# Patient Record
Sex: Male | Born: 1970 | Race: White | Hispanic: No | Marital: Married | Smoking: Never smoker
Health system: Southern US, Community
[De-identification: ages and names within clinical notes are randomized; demographics above are authoritative.]

---

## 2013-06-01 ENCOUNTER — Encounter (HOSPITAL_BASED_OUTPATIENT_CLINIC_OR_DEPARTMENT_OTHER): Payer: Self-pay

## 2013-06-01 ENCOUNTER — Emergency Department (HOSPITAL_BASED_OUTPATIENT_CLINIC_OR_DEPARTMENT_OTHER)
Admission: EM | Admit: 2013-06-01 | Discharge: 2013-06-01 | Disposition: A | Discharge status: Home / Self Care | Payer: BC Managed Care – PPO | Attending: Emergency Medicine | Admitting: Emergency Medicine

## 2013-06-01 DIAGNOSIS — Y9289 Other specified places as the place of occurrence of the external cause: Secondary | ICD-10-CM | POA: Insufficient documentation

## 2013-06-01 DIAGNOSIS — Y9389 Activity, other specified: Secondary | ICD-10-CM | POA: Insufficient documentation

## 2013-06-01 DIAGNOSIS — W57XXXA Bitten or stung by nonvenomous insect and other nonvenomous arthropods, initial encounter: Secondary | ICD-10-CM | POA: Insufficient documentation

## 2013-06-01 DIAGNOSIS — S60469A Insect bite (nonvenomous) of unspecified finger, initial encounter: Secondary | ICD-10-CM | POA: Insufficient documentation

## 2013-06-01 MED ORDER — DOXYCYCLINE HYCLATE 100 MG PO CAPS: 100.0000 mg | ORAL_CAPSULE | Freq: Two times a day (BID) | ORAL | Status: DC

## 2013-06-01 NOTE — ED Provider Notes (Signed)
  CSN: [id number]     Arrival date & time 06/01/13  1815 History  This chart was scribed for Charles B. Bernette Mayers, MD by Ardelia Mems, ED Scribe. This patient was seen in room MH02/MH02 and the patient's care was started at 7:51 PM.    Chief Complaint  Patient presents with  . Insect Bite    The history is provided by the patient. No language interpreter was used.    HPI Comments: Javier Rogers is a 42 y.o. male who presents to the Emergency Department complaining of a suspected spider bite to his left 4th finger that occurred about 3 hours ago. There is an area of redness and swelling to the finger, but there are no visible puncture wounds. He reports that he was moving an old couch out of his garage, where there are many spiders, and he suddenly noticed the area of swelling. He is concerned for the possibility that a brown recluse spider may have bit him. He states that he did not actually see an insect bite him. He states that he does not believe that he scratched the finger or otherwise injured it while moving the couch. He denies fever, chills, nausea, vomiting or any other symptoms. He denies any history of smoking and denies alcohol use.  PCP- Dr. Carlene Coria   History reviewed. No pertinent past medical history.  History reviewed. No pertinent past surgical history.  No family history on file.  History  Substance Use Topics  . Smoking status: Never Smoker   . Smokeless tobacco: Not on file  . Alcohol Use: Not on file    Review of Systems A complete 10 system review of systems was obtained and all systems are negative except as noted in the HPI and PMH.   Allergies  Review of patient's allergies indicates no known allergies.  Home Medications  No current outpatient prescriptions on file.  Triage Vitals: BP 120/75  Pulse 59  Temp(Src) 98.9 F (37.2 C) (Oral)  Resp 18  SpO2 100%  Physical Exam  Constitutional: He is oriented to person, place, and time. He  appears well-developed and well-nourished.  HENT:  Head: Normocephalic and atraumatic.  Neck: Neck supple.  Pulmonary/Chest: Effort normal.  Musculoskeletal:  Erythema to dorsal L ring finger mostly over the distal and middle phalanx, no difficulty with ROM, no drainage, no skin breaks  Neurological: He is alert and oriented to person, place, and time. No cranial nerve deficit.  Psychiatric: He has a normal mood and affect. His behavior is normal.    ED Course   Procedures (including critical care time)  DIAGNOSTIC STUDIES: Oxygen Saturation is 100% on RA, normal by my interpretation.    COORDINATION OF CARE: 7:54 PM- Pt advised of plan for treatment and pt agrees.   Labs Reviewed - No data to display  No results found.  1. Spider bite, initial encounter     MDM  Pt with likely spider bite on L ring finger, no definite signs of infection and no necrosis. Pt agrees does not need Abx now, but given Rx for Doxy to start if redness spreads or swelling develops. He is comfortable with that plan.        I personally performed the services described in this documentation, which was scribed in my presence. The recorded information has been reviewed and is accurate.     Charles B. Bernette Mayers, MD 06/01/13 [phone]

## 2013-06-01 NOTE — ED Notes (Signed)
MD at bedside. 

## 2013-06-01 NOTE — ED Notes (Signed)
Patient here with possible spider bite to left hand ring finger, was working in garage and noticed redness and discoloration to digit. No pain and did not feel bite.

## 2014-04-15 ENCOUNTER — Encounter: Payer: Self-pay | Admitting: Family Medicine

## 2014-04-15 ENCOUNTER — Ambulatory Visit (INDEPENDENT_AMBULATORY_CARE_PROVIDER_SITE_OTHER): Payer: No Typology Code available for payment source | Admitting: Family Medicine

## 2014-04-15 ENCOUNTER — Encounter (INDEPENDENT_AMBULATORY_CARE_PROVIDER_SITE_OTHER): Payer: Self-pay

## 2014-04-15 VITALS — BP 124/77 | HR 55 | Ht 69.0 in | Wt 160.0 lb

## 2014-04-15 DIAGNOSIS — M25569 Pain in unspecified knee: Secondary | ICD-10-CM

## 2014-04-15 DIAGNOSIS — M25561 Pain in right knee: Secondary | ICD-10-CM

## 2014-04-15 NOTE — Patient Instructions (Signed)
You have patellofemoral syndrome Avoid painful activities (especially squats and lunges, plyometrics) Straight leg raise, hip side raises, straight leg raise with foot turned outwards 3 sets of 10 once a day. Add ankle weight if these become too easy. Consider formal physical therapy Consider Dr. Jari SportsmanScholls active series or something similar. Icing 15 minutes at a time 3-4 times a day as needed. Tylenol or aleve as needed for pain Otherwise activities as tolerated. Follow up with me in 6 weeks or as needed.

## 2014-04-20 ENCOUNTER — Encounter: Payer: Self-pay | Admitting: Family Medicine

## 2014-04-20 DIAGNOSIS — M25561 Pain in right knee: Secondary | ICD-10-CM | POA: Insufficient documentation

## 2014-04-20 NOTE — Progress Notes (Signed)
Patient ID: Ulla PotashMark Holstrom, male   DOB: 03/13/1971, 43 y.o.   MRN: [id number]  PCP: Carlene CoriaBRASWELL,SHERRILL, MD  Subjective:   HPI: Patient is a 43 y.o. male here for right knee pain.  Patient states he's had pain under right kneecap for about 6 months. No known injury. Hurts only when running. [facility] [place] [facility] at the time this started - begins about 30 minutes into his runs. Tried new shoes prior to this that had more of a heel lift. No catching, locking, giving out. Slight pain on left.  History reviewed. No pertinent past medical history.  Current Outpatient Prescriptions on File Prior to Visit  Medication Sig Dispense Refill  . doxycycline (VIBRAMYCIN) 100 MG capsule Take 1 capsule (100 mg total) by mouth 2 (two) times daily.  14 capsule  0   No current facility-administered medications on file prior to visit.    History reviewed. No pertinent past surgical history.  No Known Allergies  History   Social History  . Marital Status: Married    Spouse Name: N/A    Number of Children: N/A  . Years of Education: N/A   Occupational History  . Not on file.   Social History Main Topics  . Smoking status: Never Smoker   . Smokeless tobacco: Not on file  . Alcohol Use: Not on file  . Drug Use: Not on file  . Sexual Activity: Not on file   Other Topics Concern  . Not on file   Social History Narrative  . No narrative on file    No family history on file.  BP 124/77  Pulse 55  Ht 5\' 9"  (1.753 m)  Wt 160 lb (72.576 kg)  BMI 23.62 kg/m2  Review of Systems: See HPI above.    Objective:  Physical Exam:  Gen: NAD  Right knee: No gross deformity, ecchymoses, swelling. No TTP currently. FROM. Negative ant/post drawers. Negative valgus/varus testing. Negative lachmanns. Negative mcmurrays, apleys, patellar apprehension. NV intact distally. Mild overpronation More outturning right foot with ambulation and running. Leg lengths 89cm left, 88.5cm  right. Hip abduction 5-/5.    Assessment & Plan:  1. Right knee pain - no pain currently but history and exam consistent with patellofemoral syndrome and risk factors.  Start home exercise program which was demonstrated today.  Arch supports.  Icing, tylenol/nsaids as needed.  Consider physical therapy.  Has a slight leg length discrepancy - will monitor - doubt this is large enough to be contributory.  F/u in 6 weeks or prn.

## 2014-04-20 NOTE — Assessment & Plan Note (Signed)
no pain currently but history and exam consistent with patellofemoral syndrome and risk factors.  Start home exercise program which was demonstrated today.  Arch supports.  Icing, tylenol/nsaids as needed.  Consider physical therapy.  Has a slight leg length discrepancy - will monitor - doubt this is large enough to be contributory.  F/u in 6 weeks or prn.

## 2014-05-27 ENCOUNTER — Ambulatory Visit (INDEPENDENT_AMBULATORY_CARE_PROVIDER_SITE_OTHER): Payer: No Typology Code available for payment source | Admitting: Family Medicine

## 2014-05-27 ENCOUNTER — Encounter: Payer: Self-pay | Admitting: Family Medicine

## 2014-05-27 VITALS — BP 124/81 | HR 52 | Ht 69.0 in | Wt 165.0 lb

## 2014-05-27 DIAGNOSIS — M25561 Pain in right knee: Secondary | ICD-10-CM

## 2014-05-27 DIAGNOSIS — M25569 Pain in unspecified knee: Secondary | ICD-10-CM

## 2014-05-28 ENCOUNTER — Encounter: Payer: Self-pay | Admitting: Family Medicine

## 2014-05-28 NOTE — Assessment & Plan Note (Signed)
2/2 patellofemoral syndrome and risk factors.  Improved clinically as well as better hip abduction.  Continue HEP, arch supports.  F/u prn.

## 2014-05-28 NOTE — Progress Notes (Signed)
Patient ID: Javier Rogers, male   DOB: 02/16/1971, 43 y.o.   MRN: [id number]  PCP: Carlene CoriaBRASWELL,SHERRILL, MD  Subjective:   HPI: Patient is a 43 y.o. male here for right knee pain.  7/1: Patient states he's had pain under right kneecap for about 6 months. No known injury. Hurts only when running. [facility] [place] [facility] [employer] [facility] [facility] the [facility] this started - begins about 30 minutes into his runs. Tried new shoes prior to this that had more of a heel lift. No catching, locking, giving out. Slight pain on left.  8/12: Patient reports he is at least 60% improved from last visit. Doing home exercises, wearing arch supports. Not needing any medications though. Able to do 90 minute run without any issues. Some pain when cycling though. No swelling or bruising. No catching, locking, giving out.  History reviewed. No pertinent past medical history.  No current outpatient prescriptions on file prior to visit.   No current facility-administered medications on file prior to visit.    History reviewed. No pertinent past surgical history.  No Known Allergies  History   Social History  . Marital Status: Married    Spouse Name: N/A    Number of Children: N/A  . Years of Education: N/A   Occupational History  . Not on file.   Social History Main Topics  . Smoking status: Never Smoker   . Smokeless tobacco: Not on file  . Alcohol Use: Not on file  . Drug Use: Not on file  . Sexual Activity: Not on file   Other Topics Concern  . Not on file   Social History Narrative  . No narrative on file    No family history on file.  BP 124/81  Pulse 52  Ht 5\' 9"  (1.753 m)  Wt 165 lb (74.844 kg)  BMI 24.36 kg/m2  Review of Systems: See HPI above.    Objective:  Physical Exam:  Gen: NAD  Right knee: No gross deformity, ecchymoses, swelling. No TTP currently. FROM. Negative ant/post drawers. Negative valgus/varus testing. Negative lachmanns. Negative mcmurrays, apleys,  patellar apprehension. NV intact distally. Mild overpronation More outturning right foot with ambulation and running. Hip abduction 5/5.    Assessment & Plan:  1. Right knee pain - 2/2 patellofemoral syndrome and risk factors.  Improved clinically as well as better hip abduction.  Continue HEP, arch supports.  F/u prn.

## 2014-06-15 ENCOUNTER — Emergency Department (HOSPITAL_BASED_OUTPATIENT_CLINIC_OR_DEPARTMENT_OTHER)
Admission: EM | Admit: 2014-06-15 | Discharge: 2014-06-15 | Disposition: A | Discharge status: Home / Self Care | Payer: No Typology Code available for payment source | Attending: Emergency Medicine | Admitting: Emergency Medicine

## 2014-06-15 ENCOUNTER — Encounter (HOSPITAL_BASED_OUTPATIENT_CLINIC_OR_DEPARTMENT_OTHER): Payer: Self-pay | Admitting: Emergency Medicine

## 2014-06-15 ENCOUNTER — Emergency Department (HOSPITAL_BASED_OUTPATIENT_CLINIC_OR_DEPARTMENT_OTHER): Payer: No Typology Code available for payment source

## 2014-06-15 DIAGNOSIS — Y92410 Unspecified street and highway as the place of occurrence of the external cause: Secondary | ICD-10-CM | POA: Diagnosis not present

## 2014-06-15 DIAGNOSIS — S0993XA Unspecified injury of face, initial encounter: Secondary | ICD-10-CM | POA: Insufficient documentation

## 2014-06-15 DIAGNOSIS — S0990XA Unspecified injury of head, initial encounter: Secondary | ICD-10-CM | POA: Diagnosis not present

## 2014-06-15 DIAGNOSIS — Y9389 Activity, other specified: Secondary | ICD-10-CM | POA: Diagnosis not present

## 2014-06-15 DIAGNOSIS — M47812 Spondylosis without myelopathy or radiculopathy, cervical region: Secondary | ICD-10-CM

## 2014-06-15 DIAGNOSIS — S199XXA Unspecified injury of neck, initial encounter: Secondary | ICD-10-CM | POA: Diagnosis not present

## 2014-06-15 MED ORDER — CYCLOBENZAPRINE HCL 10 MG PO TABS: 10.0000 mg | ORAL_TABLET | Freq: Two times a day (BID) | ORAL | Status: DC | PRN

## 2014-06-15 NOTE — Discharge Instructions (Signed)
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
## 2015-05-26 ENCOUNTER — Ambulatory Visit (INDEPENDENT_AMBULATORY_CARE_PROVIDER_SITE_OTHER): Payer: 59 | Admitting: Family Medicine

## 2015-05-26 ENCOUNTER — Encounter: Payer: Self-pay | Admitting: Family Medicine

## 2015-05-26 VITALS — BP 124/80 | HR 60 | Ht 69.0 in | Wt 165.0 lb

## 2015-05-26 DIAGNOSIS — M79604 Pain in right leg: Secondary | ICD-10-CM | POA: Diagnosis not present

## 2015-05-26 MED ORDER — METHYLPREDNISOLONE ACETATE 40 MG/ML IJ SUSP
40.0000 mg | Freq: Once | INTRAMUSCULAR | Status: AC
  Administered 2015-05-26: 40 mg via INTRA_ARTICULAR

## 2015-05-26 NOTE — Patient Instructions (Signed)
You are either getting bursitis under the quadratus muscle or overuse strain of this muscle. You were given an injection today. I would lay off sprinting, hills for a week and focus on swimming, cycling. Consider nitro patches, dry needling for this in the future. Follow up with me as needed but call if you have any questions.

## 2015-05-31 DIAGNOSIS — M79604 Pain in right leg: Secondary | ICD-10-CM | POA: Insufficient documentation

## 2015-05-31 NOTE — Progress Notes (Signed)
PCP: Carlene Coria, MD  Subjective:   HPI: Patient is a 44 y.o. male here for right leg pain.  Patient is an avid runner. He started to get pain in proximal hamstring/buttock area of right leg in mid-June. Recalls this shooting down posterior leg one run. Tried compression sleeve, PT. Increase speed an hills bother him. Doing home exercises and stretching. No swelling. Tried icing, ibuprofen as well  No past medical history on file.  Current Outpatient Prescriptions on File Prior to Visit  Medication Sig Dispense Refill  . cyclobenzaprine (FLEXERIL) 10 MG tablet Take 1 tablet (10 mg total) by mouth 2 (two) times daily as needed for muscle spasms. 20 tablet 0   No current facility-administered medications on file prior to visit.    No past surgical history on file.  No Known Allergies  Social History   Social History  . Marital Status: Married    Spouse Name: N/A  . Number of Children: N/A  . Years of Education: N/A   Occupational History  . Not on file.   Social History Main Topics  . Smoking status: Never Smoker   . Smokeless tobacco: Not on file  . Alcohol Use: No  . Drug Use: No  . Sexual Activity: Not on file   Other Topics Concern  . Not on file   Social History Narrative    No family history on file.  BP 124/80 mmHg  Pulse 60  Ht  (1.753 m)  Wt 165 lb (74.844 kg)  BMI 24.36 kg/m2  Review of Systems: See HPI above.    Objective:  Physical Exam:  Gen: NAD  Right leg: No gross deformity, swelling, bruising. TTP just lateral to ischeal tuberosity.  No other tenderness. FROM hip and knee. No pain with resisted knee flexion at 30 and 90 degrees, hip abduction or ext rotation. Strength 5/5 all motions of hip, knee. NVI distally.  MSK u/s:  No abnormalities, muscle tears, bursitis obviously visualized in area of pain near quadratus insertion.    Assessment & Plan:  1. Right leg pain - consistent with overuse strain of quadratus of  right hip based on location of pain, exam, ultrasound.  Has been doing home exercises, nsaids, icing, sleeve, PT.  Went ahead with injection today with ultrasound guidance.  Pain resolved following this injection.  After informed written consent patient was lying prostrate on exam table.  Area overlying maximal location of pain at medial quadratus prepped with alcohol swab then injected with 2:1 marcaine: depomedrol with ultrasound guidance.  Patient tolerated the procedure well without immediate complications.

## 2015-05-31 NOTE — Assessment & Plan Note (Signed)
consistent with overuse strain of quadratus of right hip based on location of pain, exam, ultrasound.  Has been doing home exercises, nsaids, icing, sleeve, PT.  Went ahead with injection today with ultrasound guidance.  Pain resolved following this injection.  After informed written consent patient was lying prostrate on exam table.  Area overlying maximal location of pain at medial quadratus prepped with alcohol swab then injected with 2:1 marcaine: depomedrol with ultrasound guidance.  Patient tolerated the procedure well without immediate complications.  

## 2015-05-31 NOTE — Assessment & Plan Note (Deleted)
consistent with overuse strain of quadratus of right hip based on location of pain, exam, ultrasound.  Has been doing home exercises, nsaids, icing, sleeve, PT.  Went ahead with injection today with ultrasound guidance.  Pain resolved following this injection.  After informed written consent patient was lying prostrate on exam table.  Area overlying maximal location of pain at medial quadratus prepped with alcohol swab then injected with 2:1 marcaine: depomedrol with ultrasound guidance.  Patient tolerated the procedure well without immediate complications.

## 2015-06-17 ENCOUNTER — Ambulatory Visit: Payer: 59 | Admitting: Family Medicine
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
## 2015-10-21 ENCOUNTER — Emergency Department (HOSPITAL_BASED_OUTPATIENT_CLINIC_OR_DEPARTMENT_OTHER)
Admission: EM | Admit: 2015-10-21 | Discharge: 2015-10-21 | Disposition: A | Discharge status: Home / Self Care | Payer: 59 | Attending: Emergency Medicine | Admitting: Emergency Medicine

## 2015-10-21 ENCOUNTER — Encounter (HOSPITAL_BASED_OUTPATIENT_CLINIC_OR_DEPARTMENT_OTHER): Payer: Self-pay

## 2015-10-21 DIAGNOSIS — Y998 Other external cause status: Secondary | ICD-10-CM | POA: Diagnosis not present

## 2015-10-21 DIAGNOSIS — S39012A Strain of muscle, fascia and tendon of lower back, initial encounter: Secondary | ICD-10-CM | POA: Insufficient documentation

## 2015-10-21 DIAGNOSIS — S0990XA Unspecified injury of head, initial encounter: Secondary | ICD-10-CM | POA: Insufficient documentation

## 2015-10-21 DIAGNOSIS — M5416 Radiculopathy, lumbar region: Secondary | ICD-10-CM | POA: Insufficient documentation

## 2015-10-21 DIAGNOSIS — R51 Headache: Secondary | ICD-10-CM

## 2015-10-21 DIAGNOSIS — Y9389 Activity, other specified: Secondary | ICD-10-CM | POA: Diagnosis not present

## 2015-10-21 DIAGNOSIS — Y92410 Unspecified street and highway as the place of occurrence of the external cause: Secondary | ICD-10-CM | POA: Insufficient documentation

## 2015-10-21 DIAGNOSIS — R519 Headache, unspecified: Secondary | ICD-10-CM

## 2015-10-21 DIAGNOSIS — S3992XA Unspecified injury of lower back, initial encounter: Secondary | ICD-10-CM | POA: Diagnosis present

## 2015-10-21 MED ORDER — ORPHENADRINE CITRATE ER 100 MG PO TB12: 100.0000 mg | ORAL_TABLET | Freq: Two times a day (BID) | ORAL | Status: DC

## 2015-10-21 MED ORDER — NAPROXEN 500 MG PO TABS: 500.0000 mg | ORAL_TABLET | Freq: Two times a day (BID) | ORAL | Status: DC
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
## 2019-05-13 ENCOUNTER — Encounter: Payer: Self-pay | Admitting: Family Medicine

## 2019-05-13 ENCOUNTER — Ambulatory Visit (INDEPENDENT_AMBULATORY_CARE_PROVIDER_SITE_OTHER): Payer: Self-pay | Admitting: Family Medicine

## 2019-05-13 DIAGNOSIS — M79601 Pain in right arm: Secondary | ICD-10-CM

## 2019-05-13 DIAGNOSIS — M75101 Unspecified rotator cuff tear or rupture of right shoulder, not specified as traumatic: Secondary | ICD-10-CM

## 2019-05-13 NOTE — Progress Notes (Signed)
I saw and examined the patient with Dr. Mayer Masker and agree with assessment and plan as outlined.  Works with Javier Rogers in PT. ultrasound today shows normal subscapularis tendon, normal long head biceps tendon but with fluid in the sheath.  Supraspinatus has a partial tear midportion with some retraction.  There is subacromial/subdeltoid bursitis.  Infraspinatus looks intact.  Did not bill for or required ultrasound images today.  Elected to inject the subacromial bursa, used ultrasound to guide needle placement.  He had good relief during the immediate anesthetic phase.  He will start doing strengthening with Javier.  If pain flares up again, would order MRI scan.

## 2019-05-13 NOTE — Progress Notes (Signed)
Javier Rogers - 48 y.o. male MRN [id number]  Date of birth: 1971-09-20  Office Visit Note: Visit Date: 05/13/2019 PCP: Vickii Chafe, MD Referred by: Vickii Chafe, MD  Subjective: Chief Complaint  Patient presents with  . Right Upper Arm - Pain    "pain in upper biceps x 2 months" Been going to PT, icing, using ibuprofen and voltaren gel otc. The gel helps.    HPI: Javier Rogers is a 48 y.o. male who comes in today with two months of right shoulder pain.  Right handed. Reports general throbbing pain over lateral part of shoulder. Shoulder pain is relieved when he abducts arm, externally rotates it behind him. 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 Since COVID, has been building a shed in backyard- sawing, hammering more. No particular injury or acute event but possibly this could have aggravated his shoulder. Saw PT last week, worked on stretching exercises and referred him here.  Volatren gel is helping some. Ibuprofen minimal benefit.   He has gone sky diving 4-5 x over the past couple weeks. Had pain when he reached up behind him to pull rope on a tandem jump.   ROS Otherwise per HPI.  Assessment & Plan: Visit Diagnoses:  1. Right arm pain   2. Tear of right supraspinatus tendon    Partial tear of supraspinatus with retraction noted on ultrasound today with fluid in biceps tendon sheath, subacromial bursitis which is consistent with pain throughout shoulder examination today.   Plan:  - ultrasound guided subacromial injection today  - work with shoulder strengthening exercises with PT - follow up PRN - consider MRI, possible surgery if no improvement   Procedures: No procedures performed  - ultrasound guided corticosteroid injection into subacromial space No notes on file   Clinical History: No specialty comments available.   He reports that he has never smoked. He does not have any smokeless tobacco history on file. No results for input(s): HGBA1C, LABURIC in the last  8760 hours.  Objective:  VS:  HT:    WT:   BMI:     BP:   HR: bpm  TEMP: ( )  RESP:  Physical Exam  PHYSICAL EXAM: Gen: NAD, alert, cooperative with exam, well-appearing HEENT: clear conjunctiva,  CV:  no edema, capillary refill brisk, normal rate Resp: non-labored Skin: no rashes, normal turgor  Neuro: no gross deficits.  Psych:  alert and oriented  Ortho Exam  Shoulder: Inspection reveals no obvious deformity, atrophy, or asymmetry. No bruising. No swelling TTP over lateral shoulder. No TTP over [facility] joint or bicipital groove. Forward flexion limited to 120 degrees due to pain, abduction limited to 120 due to pain. external rotation normal, internal rotation normal NV intact distally Normal scapular function observed. Special Tests:  - Impingement: Neg Hawkins, neers, empty can sign. - Supraspinatous: positive empty can.  5/5 strength with resisted flexion at 20 degrees-- pain with this - Infraspinatous/Teres Minor: 5/5 strength with ER-- painful - Subscapularis: positive pain with belly press 5/5 strength with IR - Biceps tendon: Positive Speeds, negative Yerrgason's  - Labrum: positive Obriens, good stability - painful arc  Imaging:  Limited ultrasound, right shoulder Normal subscapularis tendon, normal long head biceps tendon but with fluid in the sheath.  Supraspinatus has a partial tear midportion visualized on longitudinal and transverse plane with some retraction.  There is subacromial/subdeltoid bursitis.  Infraspinatus appears intact.   Past Medical/Family/Surgical/Social History: Medications & Allergies reviewed per EMR, new medications updated. Patient Active Problem List  Diagnosis Date Noted  . Right leg pain 05/31/2015  . Right knee pain 04/20/2014   History reviewed. No pertinent past medical history. History reviewed. No pertinent family history. History reviewed. No pertinent surgical history. Social History   Occupational History  . Not on file   Tobacco Use  . Smoking status: Never Smoker  Substance and Sexual Activity  . Alcohol use: No    Alcohol/week: 0.0 standard drinks  . Drug use: No  . Sexual activity: Not on file
